# Patient Record
Sex: Male | Born: 1990 | Race: White | Hispanic: No | Marital: Single | Smoking: Never smoker
Health system: Southern US, Community
[De-identification: ages and names within clinical notes are randomized; demographics above are authoritative.]

## PROBLEM LIST (undated history)

## (undated) DIAGNOSIS — J45909 Unspecified asthma, uncomplicated: Secondary | ICD-10-CM

## (undated) HISTORY — DX: Unspecified asthma, uncomplicated: J45.909

---

## 2013-03-09 ENCOUNTER — Ambulatory Visit (INDEPENDENT_AMBULATORY_CARE_PROVIDER_SITE_OTHER): Payer: BC Managed Care – PPO | Admitting: Family Medicine

## 2013-03-09 ENCOUNTER — Ambulatory Visit: Payer: BC Managed Care – PPO

## 2013-03-09 ENCOUNTER — Inpatient Hospital Stay
Admission: RE | Admit: 2013-03-09 | Discharge: 2013-03-09 | Disposition: A | Discharge status: Home / Self Care | Payer: Self-pay | Source: Ambulatory Visit | Attending: Family Medicine | Admitting: Family Medicine

## 2013-03-09 VITALS — BP 120/70 | HR 68 | Temp 98.0°F | Resp 17 | Ht 72.5 in | Wt 168.0 lb

## 2013-03-09 DIAGNOSIS — R079 Chest pain, unspecified: Secondary | ICD-10-CM

## 2013-03-09 NOTE — Patient Instructions (Signed)
Let us know if you have any more problems!  Take care

## 2013-03-09 NOTE — Progress Notes (Signed)
[facility] [address], [place] [address] [phone]  Date:  03/09/2013   Name:  Justin Goodwin   DOB:  18-Dec-1990   MRN:  [id number]  PCP:  No primary provider on file.    Chief Complaint: Chest Pain   History of Present Illness:  Justin Goodwin is a 23 y.o. very pleasant male patient who presents with the following:  About one week ago he came home from a work- out.  He was sitting- took a deep breath and  felt pain in his lower chest and back.  He talked to his mom who is a Engineer, civil (consulting); she though he had pleurisy.  He looked on the Internet and thought that pleurisy did explain his sx.  The pain resolved after 2 or 3 hours.  He noted the pain when he would inhale- a "sharp pain."  He felt fine when he worked out- he has been exercising since then and done well. He likes to lift weights and has had not had and unusual sx.    About 2 weeks ago he felt a pain in his upper abdomen- lower than the chest pain.  This resolved over a day.    He has not had any other episodes of CP.   He is generally healthy.   He is not aware of any family history of CAD.    He has noted "off and of colds" which seem to occur every couple of weeks.  The sx may be a stuffy nose, mild ST and mild HA.  He will use OTC medications for a few days and sx will resolve He has not noted any fever.    No syncope with or without exercise.    Never a smoker.  No hemoptysis.   No hormone therapy.  No prior history of DVT or PE.   No recent hospitalization, surgery.    There are no active problems to display for this patient.   Past Medical History  Diagnosis Date  . Asthma     History reviewed. No pertinent past surgical history.  History  Substance Use Topics  . Smoking status: Never Smoker   . Smokeless tobacco: Not on file  . Alcohol Use: Yes    History reviewed. No pertinent family history.  No Known Allergies  Medication list has been reviewed and updated.  No  current outpatient prescriptions on file prior to visit.   No current facility-administered medications on file prior to visit.    Review of Systems:  As per HPI- otherwise negative.   Physical Examination: Filed Vitals:   03/09/13 1442  BP: 120/70  Pulse: 68  Temp: 98 F (36.7 C)  Resp: 17   Filed Vitals:   03/09/13 1442  Height: 6' 0.5" (1.842 m)  Weight: 168 lb (76.204 kg)   Body mass index is 22.46 kg/(m^2). Ideal Body Weight: Weight in (lb) to have BMI = 25: 186.5  GEN: WDWN, NAD, Non-toxic, A & O x 3, looks well, fit build HEENT: Atraumatic, Normocephalic. Neck supple. No masses, No LAD.  Bilateral TM wnl, oropharynx normal.  PEERL,EOMI.  Ears and Nose: No external deformity. CV: RRR, No M/G/R. No JVD. No thrill. No extra heart sounds. PULM: CTA B, no wheezes, crackles, rhonchi. No retractions. No resp. distress. No accessory muscle use. ABD: S, NT, ND EXTR: No c/c/e NEURO Normal gait.  PSYCH: Normally interactive. Conversant. Not depressed or anxious appearing.  Calm demeanor.   [facility] reading (PRIMARY) by  Dr. Patsy Lageropland. CXR:  negative  EKG: SR with a partial RBBB- benign    Assessment and Plan: Chest pain - Plan: EKG 12-Lead, DG Chest 2 View, DG Chest 2 View  Likely benign MSK cause of his CP.  Discussed with Caryn BeeKevin and he feels comfortable with this dx.  He also though he was probably fine but just wanted to be sure. He felt reassured and will seek care if any other concerns   Signed Abbe AmsterdamJessica Jaryn Hocutt, MD

## 2013-03-21 ENCOUNTER — Telehealth: Payer: Self-pay

## 2013-03-21 DIAGNOSIS — R079 Chest pain, unspecified: Secondary | ICD-10-CM

## 2013-03-21 NOTE — Telephone Encounter (Signed)
Pt left message on referrals vmail. Pt asked about cardiology referral. There is no referral entered for Justin Goodwin. Based on recent ov notes it looks as if Justin Goodwin was to call us if symptoms persisted and we can request referral.   i left Justin Goodwin a message to this effect and will request referral to cardiologist from Dr Patsy Lageropland. bf

## 2013-03-22 NOTE — Telephone Encounter (Signed)
Called and left detailed message- I apologize that I had not realized he thought he was being referred to cardiology.  I do not see any definite reason why he has to see them, but if he is worried and wants to be seen that is ok.  Asked him to please let me know if anything else is going on; worsening, changing or persistent sx.  In the meantime I will make referral as he requests.

## 2013-03-22 NOTE — Telephone Encounter (Signed)
left message on machine that we were under the assumption he would call if symptoms were persistent, however we will be sending a message to dr copland for his requested referral.

## 2013-04-24 ENCOUNTER — Institutional Professional Consult (permissible substitution): Payer: Self-pay | Admitting: Cardiology

## 2013-04-30 ENCOUNTER — Institutional Professional Consult (permissible substitution): Payer: Self-pay | Admitting: Cardiology

## 2013-04-30 ENCOUNTER — Encounter: Payer: Self-pay | Admitting: Cardiology

## 2013-04-30 ENCOUNTER — Ambulatory Visit (INDEPENDENT_AMBULATORY_CARE_PROVIDER_SITE_OTHER): Payer: BC Managed Care – PPO | Admitting: Cardiology

## 2013-04-30 VITALS — BP 90/60 | HR 82 | Ht 73.0 in | Wt 166.4 lb

## 2013-04-30 DIAGNOSIS — I4510 Unspecified right bundle-branch block: Secondary | ICD-10-CM

## 2013-04-30 DIAGNOSIS — R079 Chest pain, unspecified: Secondary | ICD-10-CM

## 2013-04-30 NOTE — Patient Instructions (Signed)
Your physician recommends that you continue on your current medications as directed. Please refer to the Current Medication list given to you today.  Your physician recommends that you schedule a follow-up as needed. 

## 2013-04-30 NOTE — Progress Notes (Signed)
[address] [place], [place]  [address] Phone: [phone] Fax:  [phone]  Date:  04/30/2013   ID:  Justin Goodwin, DOB [birth year], MRN [id number]  PCP:  No primary provider on file.   History of Present Illness: Justin Goodwin is a 23 y.o. male here for the evaluation of chest pain. Originally was seen on 03/09/48 by his primary physician Dr. Dallas Schimkeopeland when he noted a "sharp pain" when he would inhale. Pain would wrap around to back. Lasted about an hour before bed at night. Also had a discomfort in left chest wall. Left arm pain as well. May have been anxiety. Running in [place] in May 2015, hospital course, cold water pool, shocks, high intensity 10 miles. Exercise with no difficulty. With weights. Symptoms were also in his upper abdomen. No early family history of CAD. No early family history of sudden death. Paternal grandmother CAD, MI and heart transplant. Dad pericarditis at 24 years old. MVP. No history of blood clots. He has grown out of asthma.   He had chest x-ray which was unremarkable. EKG showed sinus rhythm with incomplete right bundle branch block, no ST segment changes. Normal QT intervals.  Since appointment back in late February, he has not had any further symptoms. He continues to workout quite vigorously in training and has not had any difficulty. He believes that his symptoms were likely musculoskeletal.   Wt Readings from Last 3 Encounters:  04/30/13 166 lb 6.4 oz (75.479 kg)  03/09/13 168 lb (76.204 kg)     Past Medical History  Diagnosis Date  . Asthma     No past surgical history on file.  No current outpatient prescriptions on file.   No current facility-administered medications for this visit.    Allergies:   No Known Allergies  Social History:  The patient  reports that he has never smoked. He does not have any smokeless tobacco history on file. He reports that he drinks alcohol. He reports that he does not use  illicit drugs.   No family history on file.  ROS:  Please see the history of present illness.   No syncope, no bleeding, no orthopnea, no PND, no shortness of breath, no lightheadedness, no chest pain with exercise.   All other systems reviewed and negative.   PHYSICAL EXAM: VS:  BP 90/60  Pulse 82  Ht 6\' 1"  (1.854 m)  Wt 166 lb 6.4 oz (75.479 kg)  BMI 21.96 kg/m2 Well nourished, well developed, in no acute distress HEENT: normal, [place]/AT, EOMI Neck: no JVD, normal carotid upstroke, no bruit Cardiac:  normal S1, S2; RRR; no murmur Lungs:  clear to auscultation bilaterally, no wheezing, rhonchi or rales Abd: soft, nontender, no hepatomegaly, no bruits Ext: no edema, 2+ distal pulses Skin: warm and dry GU: deferred Neuro: no focal abnormalities noted, AAO x 3  EKG:  Incomplete right bundle branch block, normal intervals Chest x-ray 03/09/13-no active disease. Personally viewed.     Prior medical records reviewed  ASSESSMENT AND PLAN:  1. Incomplete right bundle branch block-I explained to him the study that was conducted amongst healthy Airmen with incidental finding of right bundle branch block on EKG. There was no increase in mortality. He is not having any high risk symptoms. Continue to monitor for any symptoms such as syncope, shortness of breath, worsening chest discomfort. For now, no further testing is warranted. 2. Chest pain-this has currently subsided. He is no longer feeling any  discomfort. This was likely musculoskeletal in etiology. He is able to exercise very vigorously without any anginal symptoms. I do not feel strongly that any further testing is necessary. He does not appear to have any evidence of prolonged QT syndrome, no early family history of sudden cardiac death, and incomplete right bundle branch block should not port tend an increase in mortality. He does not have any EKG evidence of hypertrophic cardiomyopathy. He does not have any murmurs on exam. He will let me  know if any worrisome symptoms develop.  Signed, Donato SchultzMark Skains, MD [facility]  04/30/2013 11:17 AM

## 2013-05-09 ENCOUNTER — Ambulatory Visit: Payer: Self-pay | Admitting: Cardiology

## 2017-01-28 ENCOUNTER — Emergency Department (HOSPITAL_COMMUNITY): Payer: 59

## 2017-01-28 ENCOUNTER — Emergency Department (HOSPITAL_COMMUNITY)
Admission: EM | Admit: 2017-01-28 | Discharge: 2017-01-28 | Disposition: A | Discharge status: Home / Self Care | Payer: 59 | Attending: Emergency Medicine | Admitting: Emergency Medicine

## 2017-01-28 ENCOUNTER — Encounter (HOSPITAL_COMMUNITY): Payer: Self-pay | Admitting: Emergency Medicine

## 2017-01-28 DIAGNOSIS — I880 Nonspecific mesenteric lymphadenitis: Secondary | ICD-10-CM | POA: Diagnosis not present

## 2017-01-28 DIAGNOSIS — R109 Unspecified abdominal pain: Secondary | ICD-10-CM | POA: Diagnosis present

## 2017-01-28 DIAGNOSIS — J45901 Unspecified asthma with (acute) exacerbation: Secondary | ICD-10-CM | POA: Insufficient documentation

## 2017-01-28 LAB — URINALYSIS, ROUTINE W REFLEX MICROSCOPIC
Bilirubin Urine: NEGATIVE
GLUCOSE, UA: NEGATIVE mg/dL
HGB URINE DIPSTICK: NEGATIVE
Ketones, ur: NEGATIVE mg/dL
Leukocytes, UA: NEGATIVE
Nitrite: NEGATIVE
PROTEIN: NEGATIVE mg/dL
SPECIFIC GRAVITY, URINE: 1.006 (ref 1.005–1.030)
pH: 6 (ref 5.0–8.0)

## 2017-01-28 LAB — CBC
HCT: 48.6 % (ref 39.0–52.0)
Hemoglobin: 16.8 g/dL (ref 13.0–17.0)
MCH: 31.3 pg (ref 26.0–34.0)
MCHC: 34.6 g/dL (ref 30.0–36.0)
MCV: 90.7 fL (ref 78.0–100.0)
PLATELETS: 230 10*3/uL (ref 150–400)
RBC: 5.36 MIL/uL (ref 4.22–5.81)
RDW: 12.4 % (ref 11.5–15.5)
WBC: 8 10*3/uL (ref 4.0–10.5)

## 2017-01-28 LAB — COMPREHENSIVE METABOLIC PANEL
ALBUMIN: 4.3 g/dL (ref 3.5–5.0)
ALT: 26 U/L (ref 17–63)
AST: 24 U/L (ref 15–41)
Alkaline Phosphatase: 64 U/L (ref 38–126)
Anion gap: 7 (ref 5–15)
BUN: 18 mg/dL (ref 6–20)
CO2: 27 mmol/L (ref 22–32)
CREATININE: 1.05 mg/dL (ref 0.61–1.24)
Calcium: 9.2 mg/dL (ref 8.9–10.3)
Chloride: 103 mmol/L (ref 101–111)
GFR calc non Af Amer: >60 mL/min (ref >60)
GLUCOSE: 96 mg/dL (ref 65–99)
Potassium: 4 mmol/L (ref 3.5–5.1)
SODIUM: 137 mmol/L (ref 135–145)
Total Bilirubin: 1.3 mg/dL — ABNORMAL HIGH (ref 0.3–1.2)
Total Protein: 7.3 g/dL (ref 6.5–8.1)

## 2017-01-28 LAB — LIPASE, BLOOD: LIPASE: 23 U/L (ref 11–51)

## 2017-01-28 MED ORDER — IOPAMIDOL (ISOVUE-300) INJECTION 61%
INTRAVENOUS | Status: AC
  Filled 2017-01-28: qty 100

## 2017-01-28 MED ORDER — IOPAMIDOL (ISOVUE-300) INJECTION 61%
100.0000 mL | Freq: Once | INTRAVENOUS | Status: AC | PRN
  Administered 2017-01-28: 100 mL via INTRAVENOUS

## 2017-01-28 NOTE — ED Triage Notes (Signed)
Patient reports abd pain for 3 days that been around umbilicus but since moved more upper. Patient reports pain is worse after eating and drinking. Denies any n/v but has had some diarrhea. Was seen at urgent care last night where had blood work done, WBC was normal.

## 2017-01-28 NOTE — ED Notes (Signed)
Pt ambulatory and independent at discharge.  Verbalized understanding of discharge instructions 

## 2017-01-28 NOTE — ED Provider Notes (Signed)
[facility]-EMERGENCY DEPT Provider Note   CSN: [id number] Arrival date & time: 01/28/17  1130     History   Chief Complaint Chief Complaint  Patient presents with  . Abdominal Pain  . Diarrhea    HPI Justin Goodwin is a 27 y.o. male.  HPI   27 year old male with history of asthma presenting for evaluation of abdominal pain.  Patient report for the past 2-3 days he has had recurrent abdominal pain.  Pain started in his periumbilical region, and spread around his abdomen to his back.  Pain is waxing and waning, currently rates as 2 out of 10 but last night it was more intense.  He also endorsed having some persistent bouts of diarrhea quantify as 10 episodes of loose stools with some mucus.  He denies having fever, chills, chest pain, trouble breathing, productive cough, dysuria, hematuria, hematochezia or melena.  Denies any penile or testicle pain.  No recent strenuous activities however he did start a new workout routine several days prior but states it was not strenuous.  He went to urgent care yesterday for this complaint and states blood work was drawn and patient was recommended to avoid food for 24 hours.  He did avoid eating and states the pain did improve.  He did eat 1 power bar earlier and pain worsened.  Denies history of alcohol abuse.  He is not a smoker.  Past Medical History:  Diagnosis Date  . Asthma     There are no active problems to display for this patient.   History reviewed. No pertinent surgical history.     Home Medications    Prior to Admission medications   Not on File    Family History No family history on file.  Social History Social History   Tobacco Use  . Smoking status: Never Smoker  . Smokeless tobacco: Never Used  Substance Use Topics  . Alcohol use: Yes  . Drug use: No     Allergies   Patient has no known allergies.   Review of Systems Review of Systems  All other systems reviewed and are  negative.    Physical Exam Updated Vital Signs BP 124/83 (BP Location: Left Arm)   Pulse 83   Temp 97.9 F (36.6 C) (Oral)   Resp 16   Ht 6\' 1"  (1.854 m)   Wt 77.1 kg (170 lb)   SpO2 97%   BMI 22.43 kg/m   Physical Exam  Constitutional: He appears well-developed and well-nourished. No distress.  Patient is well-appearing nontoxic in no acute discomfort.  HENT:  Head: Atraumatic.  Eyes: Conjunctivae are normal.  Neck: Neck supple.  Cardiovascular: Normal rate and regular rhythm.  Pulmonary/Chest: Effort normal and breath sounds normal. He has no wheezes. He exhibits no tenderness.  Abdominal: Soft. Normal appearance and bowel sounds are normal. There is tenderness in the periumbilical area. There is guarding. There is no tenderness at McBurney's point and negative Murphy's sign. Hernia confirmed negative in the right inguinal area and confirmed negative in the left inguinal area.  Neurological: He is alert.  Skin: No rash noted.  Psychiatric: He has a normal mood and affect.  Nursing note and vitals reviewed.    ED Treatments / Results  Labs (all labs ordered are listed, but only abnormal results are displayed) Labs Reviewed  COMPREHENSIVE METABOLIC PANEL - Abnormal; Notable for the following components:      Result Value   Total Bilirubin 1.3 (*)    All  other components within normal limits  URINALYSIS, ROUTINE W REFLEX MICROSCOPIC - Abnormal; Notable for the following components:   Color, Urine STRAW (*)    All other components within normal limits  LIPASE, BLOOD  CBC    EKG  EKG Interpretation None       Radiology Ct Abdomen Pelvis W Contrast  Result Date: 01/28/2017 CLINICAL DATA:  Abdominal pain for 3 days, diarrhea x 2 EXAM: CT ABDOMEN AND PELVIS WITH CONTRAST TECHNIQUE: Multidetector CT imaging of the abdomen and pelvis was performed using the standard protocol following bolus administration of intravenous contrast. Sagittal and coronal MPR images  reconstructed from axial data set. CONTRAST:  100mL ISOVUE-300 IOPAMIDOL (ISOVUE-300) INJECTION 61% IV. No oral contrast. COMPARISON:  None FINDINGS: Lower chest: Lung bases clear Hepatobiliary: Gallbladder and liver normal appearance Pancreas: Normal appearance Spleen: Normal appearance Adrenals/Urinary Tract: Adrenal glands, kidneys, ureters, and bladder normal appearance Stomach/Bowel: Questionable visualization of a normal appendix. No definite pericecal inflammatory process. Stomach and bowel loops otherwise normal appearance. Vascular/Lymphatic: Vascular structures unremarkable. Clustered normal sized mesenteric lymph nodes especially in RIGHT lower quadrant medial to cecum. Reproductive: Unremarkable Other: No free air or free fluid. No definite inflammatory process or hernia. Musculoskeletal: Normal appearance. IMPRESSION: Numerous normal sized mesenteric lymph nodes in RIGHT lower quadrant extending to mid abdomen, nonspecific but could represent a viral process such as mesenteric adenitis. Remainder of exam unremarkable. Electronically Signed   By: Ulyses SouthwardMark  Boles M.D.   On: 01/28/2017 17:44    Procedures Procedures (including critical care time)  Medications Ordered in ED Medications  iopamidol (ISOVUE-300) 61 % injection (not administered)  iopamidol (ISOVUE-300) 61 % injection 100 mL (100 mLs Intravenous Contrast Given 01/28/17 1725)     Initial Impression / Assessment and Plan / ED Course  I have reviewed the triage vital signs and the nursing notes.  Pertinent labs & imaging results that were available during my care of the patient were reviewed by me and considered in my medical decision making (see chart for details).     BP 128/75 (BP Location: Left Arm)   Pulse 75   Temp 97.9 F (36.6 C) (Oral)   Resp 16   Ht 6\' 1"  (1.854 m)   Wt 77.1 kg (170 lb)   SpO2 100%   BMI 22.43 kg/m    Final Clinical Impressions(s) / ED Diagnoses   Final diagnoses:  Mesenteric adenitis     ED Discharge Orders    None     3:32 PM Patient here with periUmbilical abdominal pain as well as some diarrhea.  On examination he does have tenderness to his periumbilical region with guarding.  Negative Murphy sign, no pain at McBurney's point.  Blood work is essentially unremarkable.  No leukocytosis, no electrolytes imbalance, urine without signs of urinary tract infection and no blood in urine.  Given the duration of his pain and the location of his pain, will obtain an abdominal pelvic CT scan to rule out acute abdominal pathology.  Patient agrees with plan.  6:04 PM Abdominal and pelvis CT scan shows numerous normal sized mesenteric lymph nodes in the right lower quadrant extending to the mid abdomen nonspecific but could represent a viral process such as mesenteric adenitis.  No evidence of appendicitis.  The finding is consistent with patient's presentation.  He is well-appearing, labs are normal I do agree that this is likely to be a viral etiology.  Reassurance given.  Recommend NSAIDs as needed and bland food.  Return precautions discussed.  Fayrene Helper, PA-C 01/28/17 1806    Derwood Kaplan, MD 01/29/17 1600

## 2017-09-28 NOTE — Progress Notes (Deleted)
     MRN: [id number] DOB: 02-20-1990  Subjective:   Justin Goodwin is a 27 y.o. male presenting for chief complaint of No chief complaint on file. .  Reports *** history of {URI Symptoms :210800001}, {Systemic Symptoms:6363418679}. Has tried *** relief. Denies ***fever, {URI Symptoms :210800001}, {Systemic Symptoms:6363418679}. Has *** had *** sick contact with ***. *** history of seasonal allergies, history of asthma. Patient *** flu shot this season. *** smoking, *** alcohol. Denies any other aggravating or relieving factors, no other questions or concerns.  Justin Goodwin currently has no medications in their medication list. Also has No Known Allergies.  Justin Goodwin  has a past medical history of Asthma. Also  has no past surgical history on file.   Objective:   Vitals: There were no vitals taken for this visit.  Physical Exam  No results found for this or any previous visit (from the past 24 hour(s)).  Assessment and Plan :  There are no diagnoses linked to this encounter.  Benjiman Core, PA-C  Primary [facility] at [facility] 09/28/2017 9:58 PM

## 2017-09-29 ENCOUNTER — Ambulatory Visit: Payer: 59 | Admitting: Physician Assistant

## 2019-06-01 IMAGING — CT CT ABD-PELV W/ CM
2 of 4 series · 16 of 46 positions shown, 18 images · IV contrast (ISOVUE)
Comparison: None

CLINICAL DATA: Abdominal pain for 3 days, diarrhea x 2

EXAM:
CT ABDOMEN AND PELVIS WITH CONTRAST
TECHNIQUE: Multidetector CT imaging of the abdomen and pelvis was performed
using the standard protocol following bolus administration of
intravenous contrast. Sagittal and coronal MPR images reconstructed
from axial data set.
CONTRAST:  100mL ENCWJD-IEE IOPAMIDOL (ENCWJD-IEE) INJECTION 61% IV.
No oral contrast.

[Series 2: axial st · axial · 0.68mm/px · z∈[-446,-26]mm · 13 of 94 slices shown, 15 images]
[im 5/94  soft-tissue]
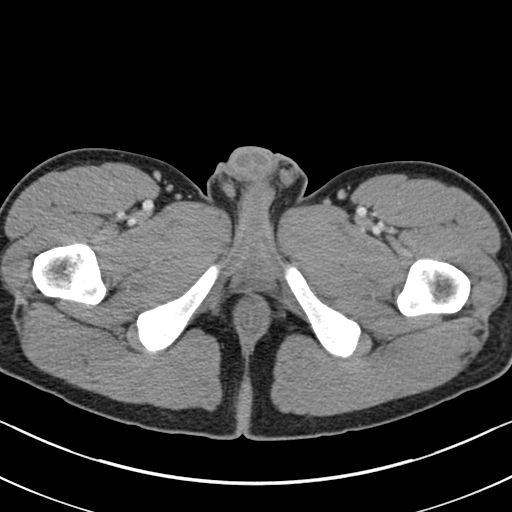
[im 5/94  bone]
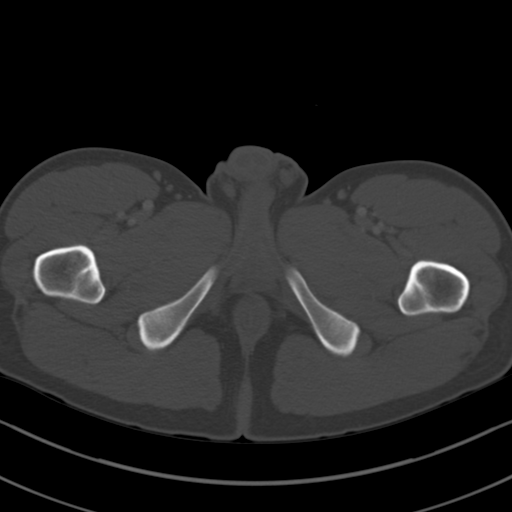
[im 14/94  soft-tissue]
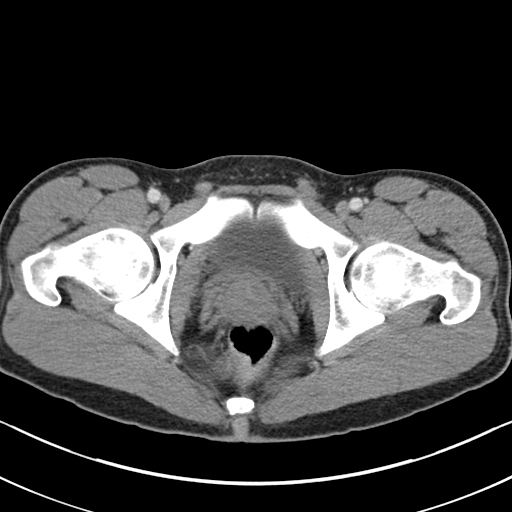
[im 18/94  soft-tissue]
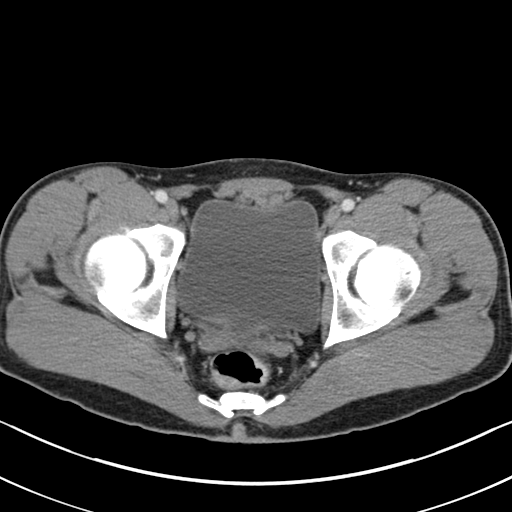
[im 27/94  soft-tissue]
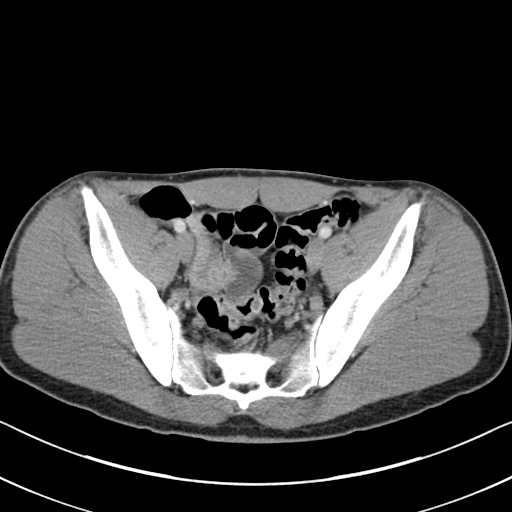
[im 32/94  soft-tissue]
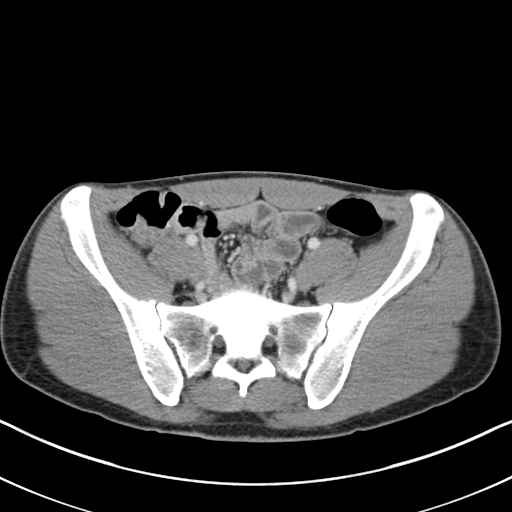
[im 40/94  soft-tissue]
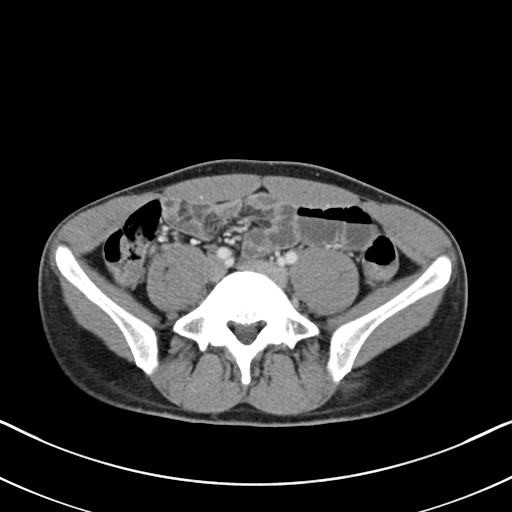
[im 49/94  soft-tissue]
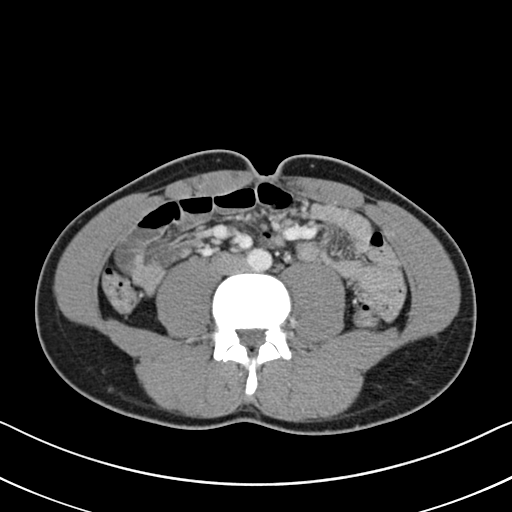
[im 54/94  soft-tissue]
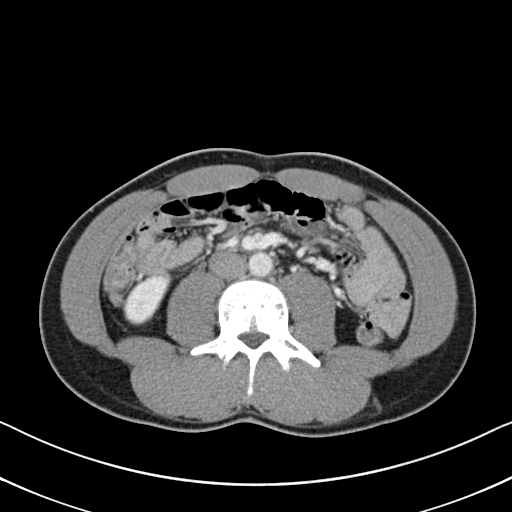
[im 63/94  soft-tissue]
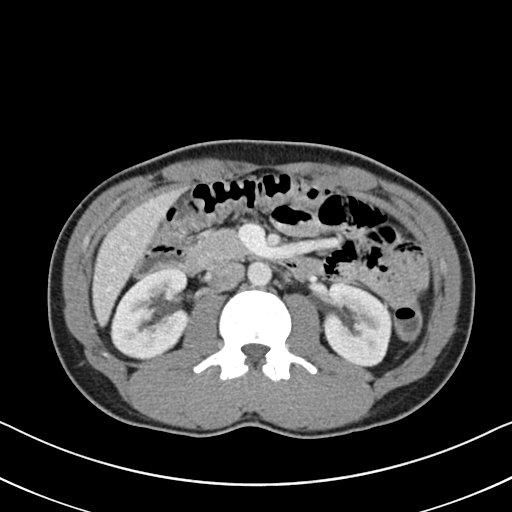
[im 63/94  bone]
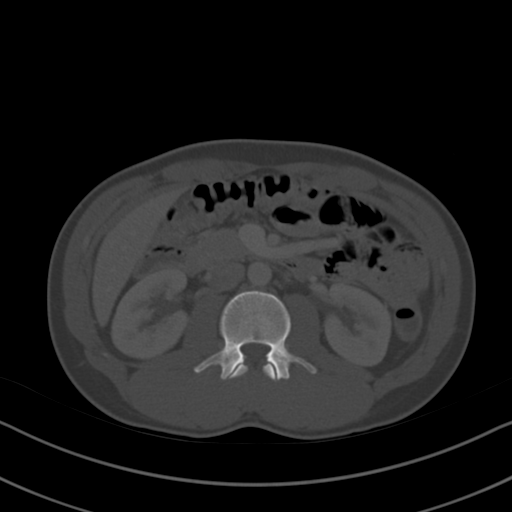
[im 67/94  soft-tissue]
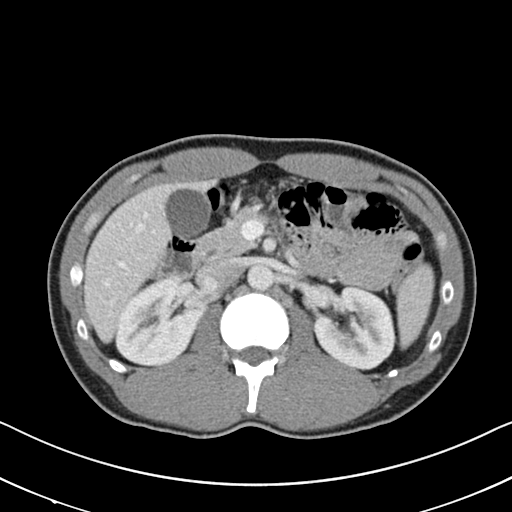
[im 76/94  soft-tissue]
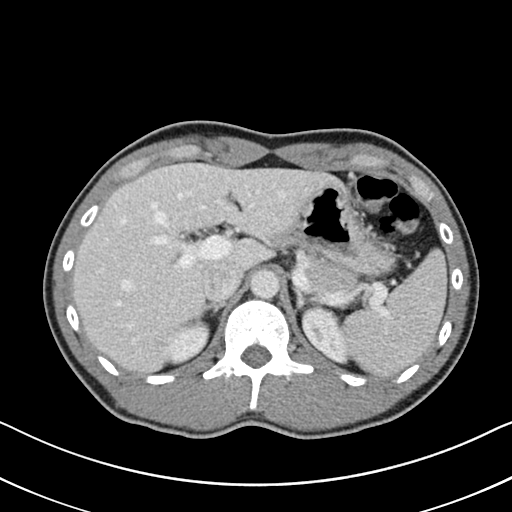
[im 80/94  soft-tissue]
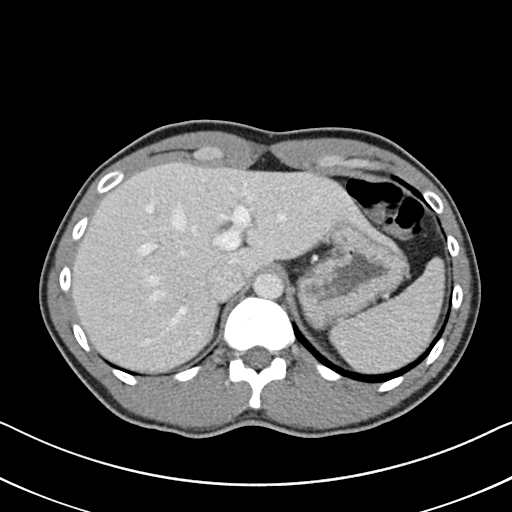
[im 89/94  soft-tissue]
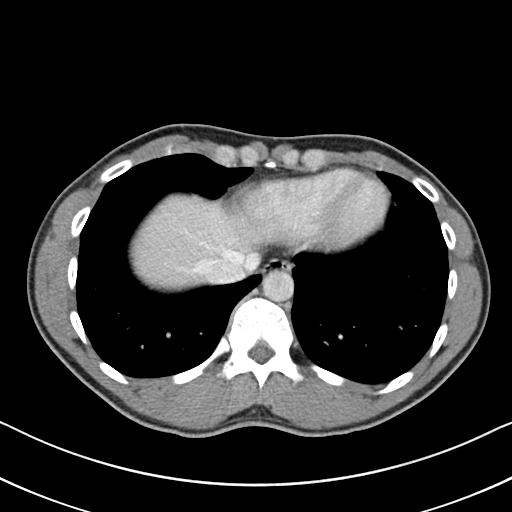

[Series 4: coronal st · coronal · 0.70mm/px · 3 of 70 slices shown]
[im 24/70  soft-tissue]
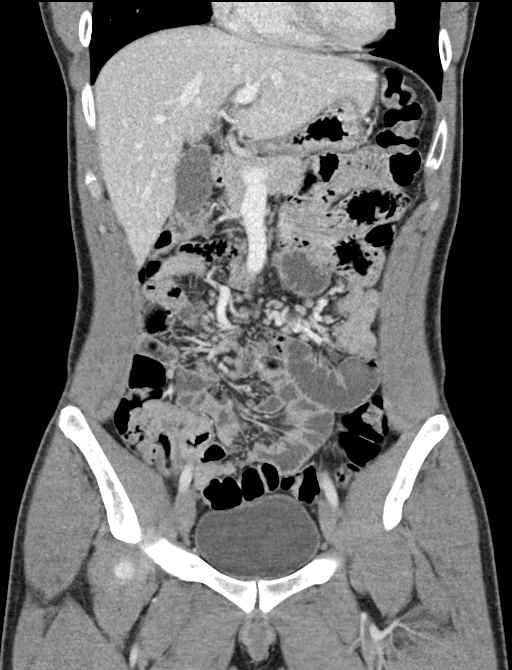
[im 31/70  soft-tissue]
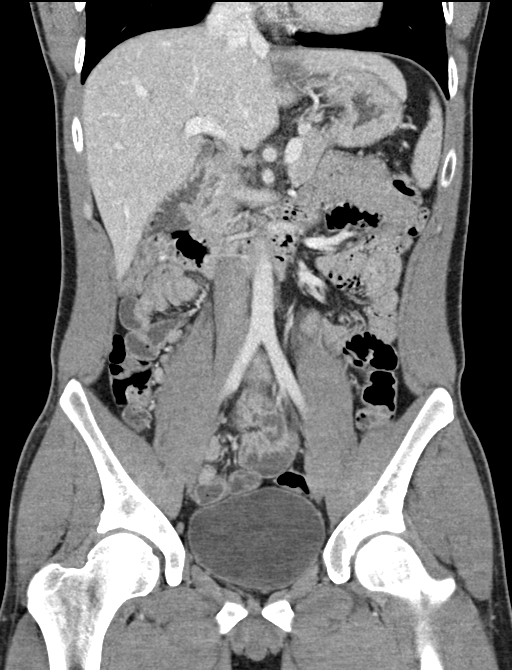
[im 39/70  soft-tissue]
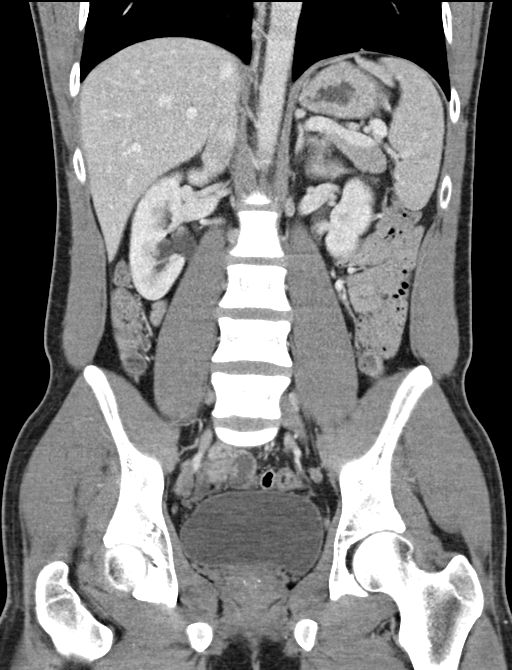

[16 of 46 positions shown; findings below may reference images not displayed]

FINDINGS: Lower chest: Lung bases clear

Hepatobiliary: Gallbladder and liver normal appearance

Pancreas: Normal appearance

Spleen: Normal appearance

Adrenals/Urinary Tract: Adrenal glands, kidneys, ureters, and
bladder normal appearance

Stomach/Bowel: Questionable visualization of a normal appendix. No
definite pericecal inflammatory process. Stomach and bowel loops
otherwise normal appearance.

Vascular/Lymphatic: Vascular structures unremarkable. Clustered
normal sized mesenteric lymph nodes especially in RIGHT lower
quadrant medial to cecum.

Reproductive: Unremarkable

Other: No free air or free fluid. No definite inflammatory process
or hernia.

Musculoskeletal: Normal appearance.
IMPRESSION: Numerous normal sized mesenteric lymph nodes in RIGHT lower quadrant
extending to mid abdomen, nonspecific but could represent a viral
process such as mesenteric adenitis.

Remainder of exam unremarkable.
